# Patient Record
Sex: Male | Born: 1995 | Race: White | Hispanic: No | Marital: Single | Smoking: Never smoker
Health system: Southern US, Community
[De-identification: ages and names within clinical notes are randomized; demographics above are authoritative.]

## PROBLEM LIST (undated history)

## (undated) DIAGNOSIS — J189 Pneumonia, unspecified organism: Secondary | ICD-10-CM

## (undated) HISTORY — PX: TONSILLECTOMY AND ADENOIDECTOMY: SUR1326

---

## 2016-06-04 ENCOUNTER — Encounter (HOSPITAL_COMMUNITY): Payer: Self-pay | Admitting: Emergency Medicine

## 2016-06-04 ENCOUNTER — Emergency Department (HOSPITAL_COMMUNITY)
Admission: EM | Admit: 2016-06-04 | Discharge: 2016-06-04 | Disposition: A | Discharge status: Home / Self Care | Payer: BLUE CROSS/BLUE SHIELD | Attending: Emergency Medicine | Admitting: Emergency Medicine

## 2016-06-04 ENCOUNTER — Emergency Department (HOSPITAL_COMMUNITY): Payer: BLUE CROSS/BLUE SHIELD

## 2016-06-04 DIAGNOSIS — S8992XA Unspecified injury of left lower leg, initial encounter: Secondary | ICD-10-CM | POA: Diagnosis not present

## 2016-06-04 DIAGNOSIS — Y929 Unspecified place or not applicable: Secondary | ICD-10-CM | POA: Diagnosis not present

## 2016-06-04 DIAGNOSIS — F17200 Nicotine dependence, unspecified, uncomplicated: Secondary | ICD-10-CM | POA: Insufficient documentation

## 2016-06-04 DIAGNOSIS — Y9389 Activity, other specified: Secondary | ICD-10-CM | POA: Insufficient documentation

## 2016-06-04 DIAGNOSIS — S40011A Contusion of right shoulder, initial encounter: Secondary | ICD-10-CM | POA: Diagnosis not present

## 2016-06-04 DIAGNOSIS — Z791 Long term (current) use of non-steroidal anti-inflammatories (NSAID): Secondary | ICD-10-CM | POA: Diagnosis not present

## 2016-06-04 DIAGNOSIS — Y999 Unspecified external cause status: Secondary | ICD-10-CM | POA: Diagnosis not present

## 2016-06-04 DIAGNOSIS — W500XXA Accidental hit or strike by another person, initial encounter: Secondary | ICD-10-CM | POA: Insufficient documentation

## 2016-06-04 DIAGNOSIS — S4991XA Unspecified injury of right shoulder and upper arm, initial encounter: Secondary | ICD-10-CM | POA: Diagnosis present

## 2016-06-04 HISTORY — DX: Pneumonia, unspecified organism: J18.9

## 2016-06-04 MED ORDER — OXYCODONE-ACETAMINOPHEN 5-325 MG PO TABS: 2.0000 | ORAL_TABLET | ORAL | 0 refills | Status: AC | PRN

## 2016-06-04 MED ORDER — HYDROCODONE-ACETAMINOPHEN 5-325 MG PO TABS
2.0000 | ORAL_TABLET | Freq: Once | ORAL | Status: AC
  Administered 2016-06-04: 2 via ORAL
  Filled 2016-06-04: qty 2

## 2016-06-04 MED ORDER — FENTANYL CITRATE (PF) 100 MCG/2ML IJ SOLN
50.0000 ug | Freq: Once | INTRAMUSCULAR | Status: AC
  Administered 2016-06-04: 50 ug via INTRAVENOUS
  Filled 2016-06-04: qty 2

## 2016-06-04 NOTE — ED Triage Notes (Signed)
He states he injured his left knee when he was struck py an opponent in a rugby game today. He arrives with his left leg in a long foam splint, which we maintain. CMS intact all toes bilat.

## 2016-06-04 NOTE — ED Provider Notes (Signed)
[facility]-EMERGENCY DEPT Provider Note   CSN: [id number] Arrival date & time: 06/04/16  1428     History   Chief Complaint Chief Complaint  Patient presents with  . Knee Injury    HPI Colin Taylor is a 20 y.o. male.  Knee Pain   This is a new problem. The current episode started 3 to 5 hours ago. The problem occurs constantly. The problem has been gradually worsening. The pain is present in the left knee. The pain is moderate. Associated symptoms include limited range of motion. The treatment provided no relief. There has been a history of trauma.   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  Pt complains of knee and shoulder pain.  Pt had relief with fentynl by EMS.  Mother reports knee cap dislocated twice on field and teammates put it in.   Past Medical History:  Diagnosis Date  . Pneumonia     There are no active problems to display for this patient.   Past Surgical History:  Procedure Laterality Date  . TONSILLECTOMY AND ADENOIDECTOMY         Home Medications    Prior to Admission medications   Medication Sig Start Date End Date Taking? Authorizing Provider  AMOXICILLIN PO Take 1 capsule by mouth 2 (two) times daily.   Yes Historical Provider, MD  benzoyl peroxide 10 % gel Apply 1 application topically at bedtime. 03/22/16  Yes Historical Provider, MD  ibuprofen (ADVIL,MOTRIN) 200 MG tablet Take 600 mg by mouth every 6 (six) hours as needed for moderate pain or cramping.   Yes Historical Provider, MD  minocycline (MINOCIN,DYNACIN) 100 MG capsule Take 100 mg by mouth 2 (two) times daily. 05/16/16  Yes Historical Provider, MD    Family History No family history on file.  Social History Social History  Substance Use Topics  . Smoking status: Never Smoker  . Smokeless tobacco: Current User  . Alcohol use No     Allergies   Lactose intolerance (gi)   Review of Systems Review of Systems  All other systems reviewed and are  negative.    Physical Exam Updated Vital Signs BP 130/76 (BP Location: Right Arm)   Pulse 90   Temp 97.9 F (36.6 C) (Oral)   Resp 18   SpO2 100%   Physical Exam  Constitutional: He appears well-developed and well-nourished.  HENT:  Head: Normocephalic and atraumatic.  Cardiovascular: Normal rate.   Pulmonary/Chest: Effort normal.  Musculoskeletal: He exhibits tenderness.  Swollen left knee,  Bruised below knee cap medially.  Patella movable,  In joint,  No dislocation. Large effusion, femur nontender, tibia nontender, nv and ns intact  Neurological: He is alert. He has normal reflexes.  Skin: Skin is warm.  Nursing note and vitals reviewed.  Shoulder pain with   ED Treatments / Results  Labs (all labs ordered are listed, but only abnormal results are displayed) Labs Reviewed - No data to display  EKG  EKG Interpretation None       Radiology Dg Shoulder Right  Result Date: 06/04/2016 CLINICAL DATA:  Pain following injury during rugby match EXAM: RIGHT SHOULDER - 2+ VIEW COMPARISON:  None. FINDINGS: Frontal, oblique, and Y scapular images were obtained. There is no apparent fracture or dislocation. The joint spaces appear normal. No erosive change. Visualized right lung clear. IMPRESSION: No fracture or dislocation.  No apparent arthropathy. Electronically Signed   By: Bretta BangWilliam  Woodruff III M.D.   On: 06/04/2016 15:55  Dg Knee Complete 4 Views Left  Result Date: 06/04/2016 CLINICAL DATA:  Pain after injury during rugby match EXAM: LEFT KNEE - COMPLETE 4+ VIEW COMPARISON:  None. FINDINGS: Frontal, lateral common bile oblique views were obtained. There is lateral patellar subluxation. There is no frank dislocation or fracture. There is a fairly small joint effusion. There is no appreciable joint space narrowing. No erosive change. IMPRESSION: Lateral patellar subluxation without frank dislocation. No fracture. Fairly small joint effusion. No appreciable joint space  narrowing. Electronically Signed   By: Bretta Bang III M.D.   On: 06/04/2016 15:54    Procedures Procedures (including critical care time)  Medications Ordered in ED Medications  HYDROcodone-acetaminophen (NORCO/VICODIN) 5-325 MG per tablet 2 tablet (2 tablets Oral Given 06/04/16 1717)     Initial Impression / Assessment and Plan / ED Course  I have reviewed the triage vital signs and the nursing notes.  Pertinent labs & imaging results that were available during my care of the patient were reviewed by me and considered in my medical decision making (see chart for details).  Clinical Course  Value Comment By Time  DG Knee Complete 4 Views Left (Reviewed) Elson Areas, PA-C 09/23 1647  DG Knee Complete 4 Views Left (Reviewed) Elson Areas, PA-C 09/23 1725    Dr. Madilyn Hook in to see and examine. Family and pt advised of need to see Orthopaedist.  Pt most likely has a ligamentous tear.  Pt given crutches and knee imbolizer  Final Clinical Impressions(s) / ED Diagnoses   Final diagnoses:  Left knee injury, initial encounter  Contusion of right shoulder, initial encounter    New Prescriptions New Prescriptions   OXYCODONE-ACETAMINOPHEN (PERCOCET/ROXICET) 5-325 MG TABLET    Take 2 tablets by mouth every 4 (four) hours as needed for severe pain.     Lonia Skinner Globe, PA-C 06/04/16 1823    Tilden Fossa, MD 06/07/16 [phone]

## 2017-07-02 IMAGING — CR DG SHOULDER 2+V*R*
3 series · 3 of 3 positions shown · non-contrast
Comparison: None.

CLINICAL DATA: Pain following injury during rugby match

EXAM:
RIGHT SHOULDER - 2+ VIEW

[x shoulder ap right (1 of 3)]
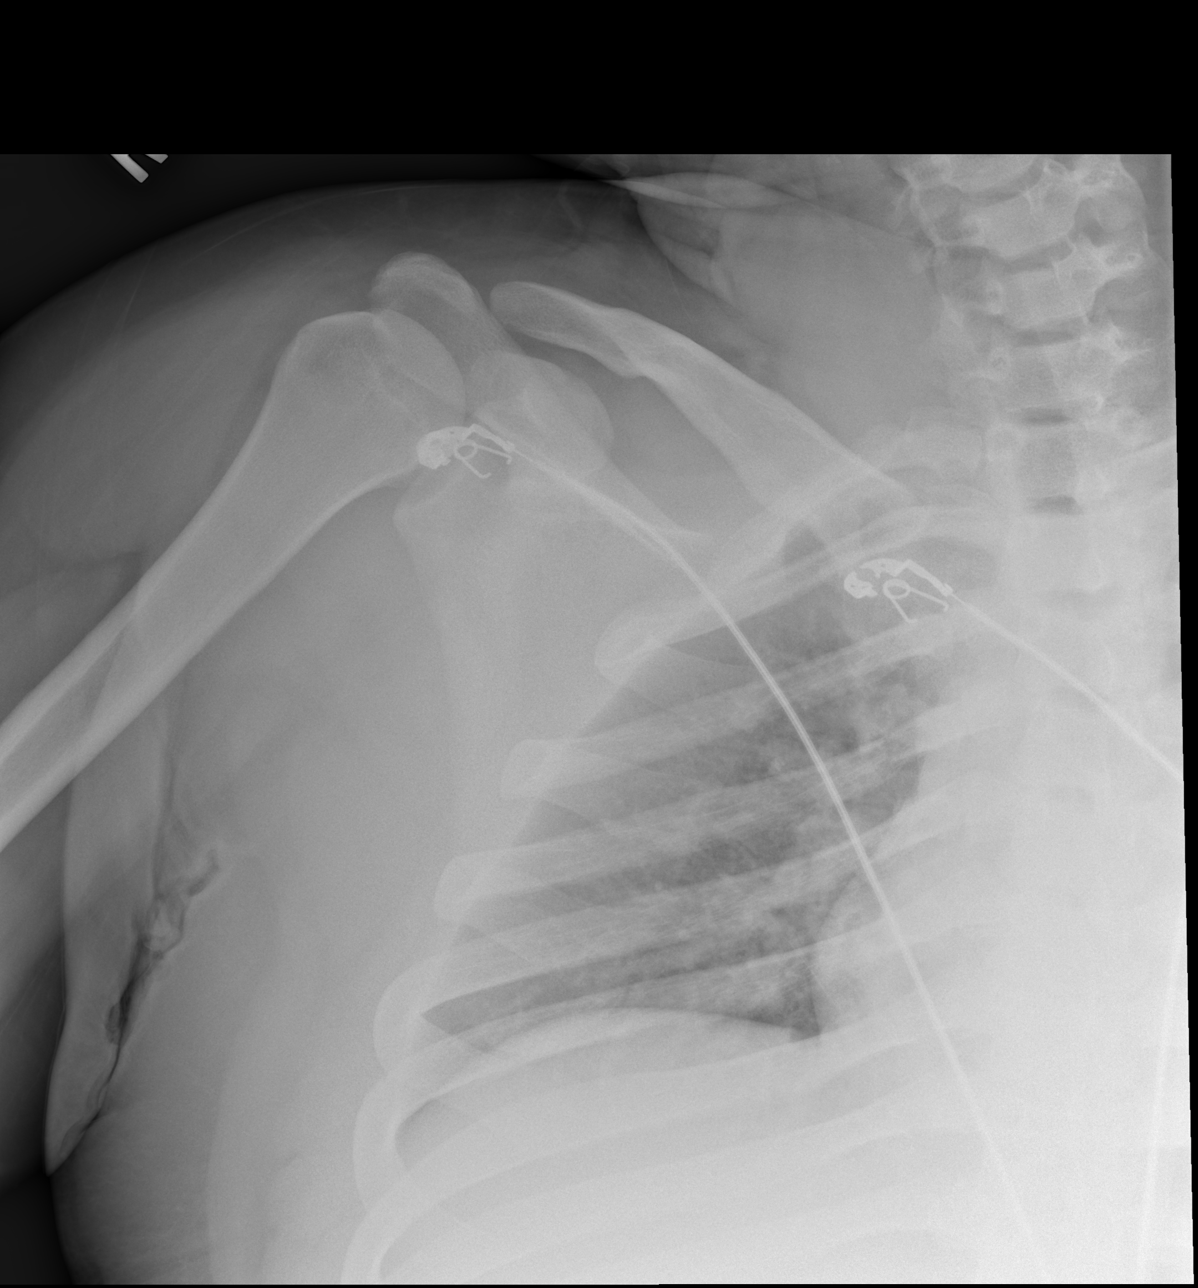

[x shoulder ap right (2 of 3)]
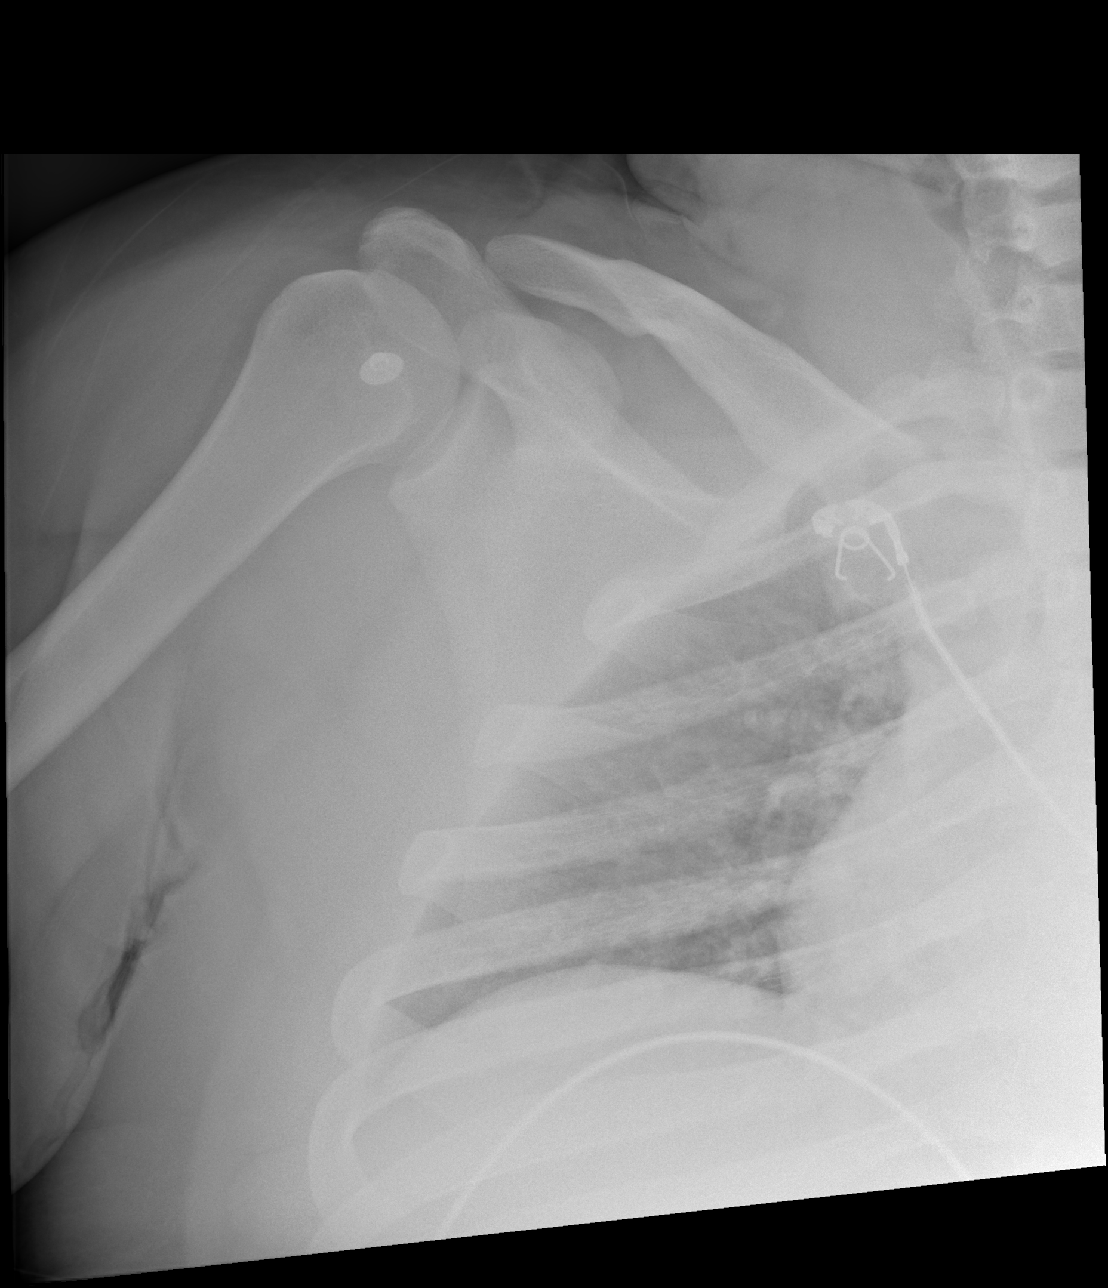

[x shoulder ap right (3 of 3)]
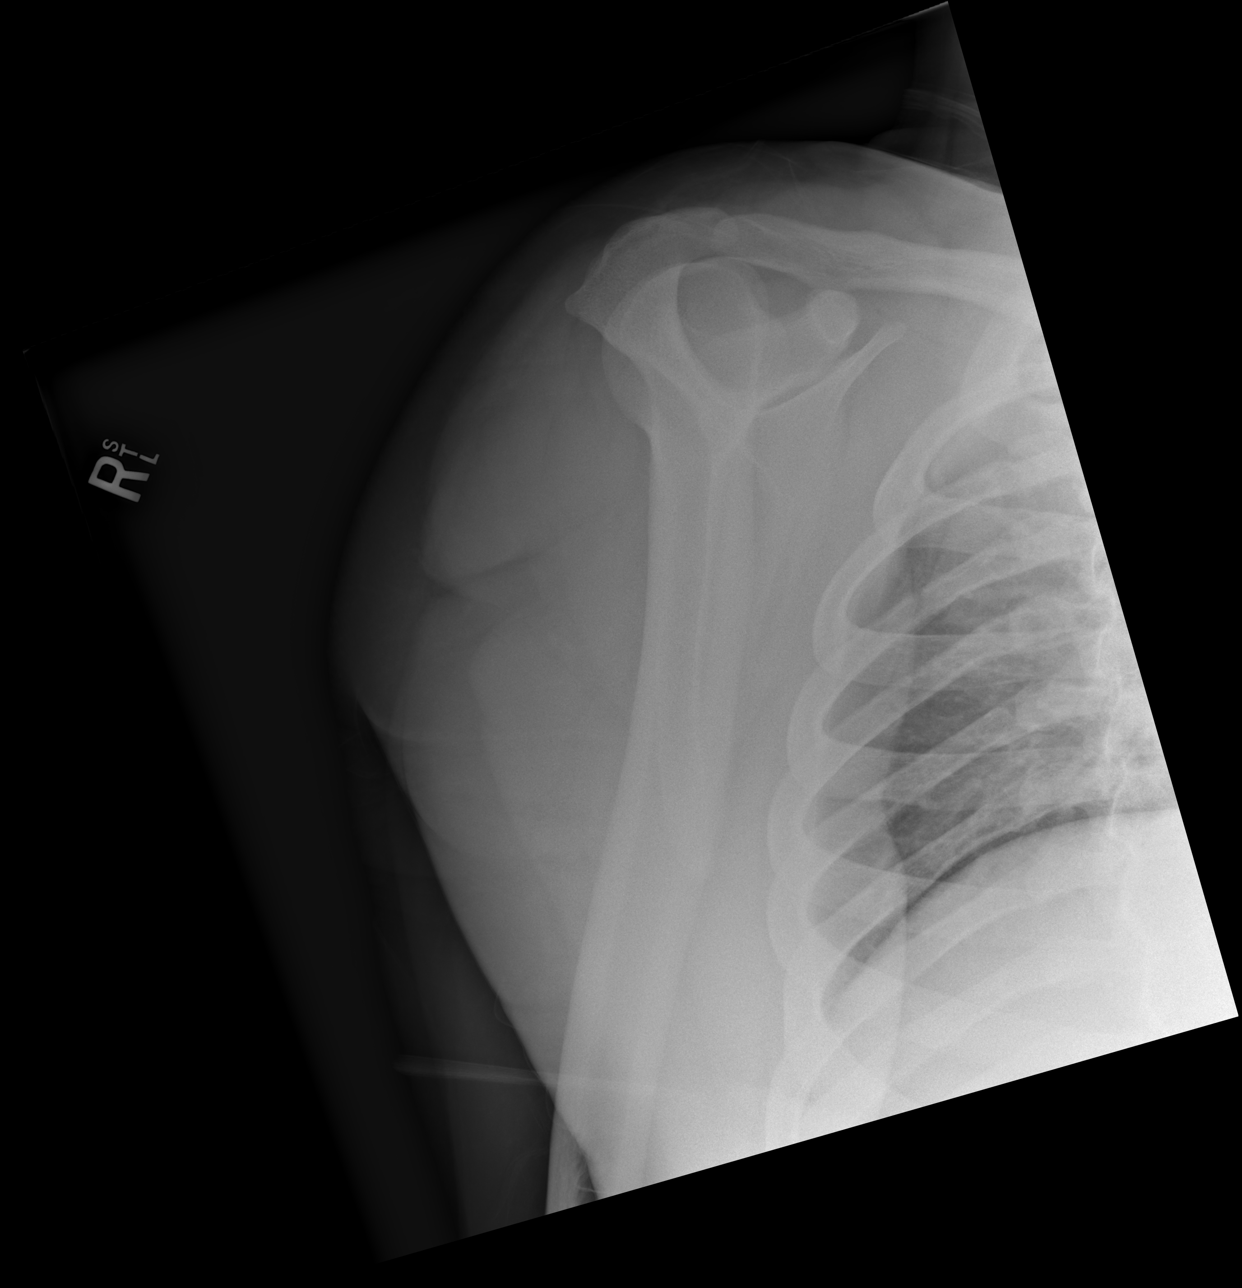

[3 of 3 positions shown; findings below may reference images not displayed]

FINDINGS: Frontal, oblique, and Y scapular images were obtained. There is no
apparent fracture or dislocation. The joint spaces appear normal. No
erosive change. Visualized right lung clear.
IMPRESSION: No fracture or dislocation.  No apparent arthropathy.
